# Patient Record
Sex: Male | Born: 1955 | Race: White | Hispanic: No | Marital: Married | Smoking: Never smoker
Health system: Southern US, Community
[De-identification: ages and names within clinical notes are randomized; demographics above are authoritative.]

---

## 1999-03-18 ENCOUNTER — Emergency Department (HOSPITAL_COMMUNITY): Admission: EM | Admit: 1999-03-18 | Discharge: 1999-03-18 | Payer: Self-pay | Admitting: Emergency Medicine

## 1999-05-03 ENCOUNTER — Emergency Department (HOSPITAL_COMMUNITY): Admission: EM | Admit: 1999-05-03 | Discharge: 1999-05-03 | Payer: Self-pay | Admitting: Emergency Medicine

## 2017-10-24 ENCOUNTER — Other Ambulatory Visit: Payer: Self-pay

## 2017-10-24 ENCOUNTER — Encounter (HOSPITAL_BASED_OUTPATIENT_CLINIC_OR_DEPARTMENT_OTHER): Payer: Self-pay | Admitting: *Deleted

## 2017-10-24 ENCOUNTER — Emergency Department (HOSPITAL_BASED_OUTPATIENT_CLINIC_OR_DEPARTMENT_OTHER): Payer: No Typology Code available for payment source

## 2017-10-24 ENCOUNTER — Emergency Department (HOSPITAL_BASED_OUTPATIENT_CLINIC_OR_DEPARTMENT_OTHER)
Admission: EM | Admit: 2017-10-24 | Discharge: 2017-10-24 | Disposition: A | Discharge status: Home / Self Care | Payer: No Typology Code available for payment source | Attending: Emergency Medicine | Admitting: Emergency Medicine

## 2017-10-24 DIAGNOSIS — B349 Viral infection, unspecified: Secondary | ICD-10-CM | POA: Diagnosis not present

## 2017-10-24 DIAGNOSIS — J069 Acute upper respiratory infection, unspecified: Secondary | ICD-10-CM | POA: Insufficient documentation

## 2017-10-24 DIAGNOSIS — R062 Wheezing: Secondary | ICD-10-CM | POA: Diagnosis present

## 2017-10-24 DIAGNOSIS — B9789 Other viral agents as the cause of diseases classified elsewhere: Secondary | ICD-10-CM

## 2017-10-24 DIAGNOSIS — R05 Cough: Secondary | ICD-10-CM | POA: Diagnosis not present

## 2017-10-24 MED ORDER — ALBUTEROL SULFATE HFA 108 (90 BASE) MCG/ACT IN AERS: 1.0000 | INHALATION_SPRAY | Freq: Four times a day (QID) | RESPIRATORY_TRACT | 0 refills | Status: AC | PRN

## 2017-10-24 MED ORDER — PREDNISONE 20 MG PO TABS: 40.0000 mg | ORAL_TABLET | Freq: Every day | ORAL | 0 refills | Status: AC

## 2017-10-24 MED ORDER — BENZONATATE 100 MG PO CAPS: 100.0000 mg | ORAL_CAPSULE | Freq: Three times a day (TID) | ORAL | 0 refills | Status: AC

## 2017-10-24 MED ORDER — ALBUTEROL SULFATE (2.5 MG/3ML) 0.083% IN NEBU
2.5000 mg | INHALATION_SOLUTION | Freq: Once | RESPIRATORY_TRACT | Status: AC
  Administered 2017-10-24: 2.5 mg via RESPIRATORY_TRACT
  Filled 2017-10-24: qty 3

## 2017-10-24 NOTE — ED Triage Notes (Signed)
Pt reports intermittent wheezing and productive cough since Christmas Eve. Denies fever, n/v/d, sob, chest pain.

## 2017-10-24 NOTE — ED Provider Notes (Signed)
Emergency Department Provider Note   I have reviewed the triage vital signs and the nursing notes.   HISTORY  Chief Complaint Cough   HPI Robert Escobar is a 61 y.o. male resents to the emergency department for evaluation of productive cough and some intermittent wheezing over the past 6 days.  Patient states that he was outside working became hot.  He began removing clothes and states that he was working outside in the cold.  2 days later he began having cough and wheezing symptoms.  Denies fevers.  No hemoptysis.  He denies any nausea, vomiting, diarrhea.  No chest pain.  Patient states he does have some abdominal soreness with coughing.  He is tried Mucinex at home with no relief.    History reviewed. No pertinent past medical history.  There are no active problems to display for this patient.   History reviewed. No pertinent surgical history.    Allergies Patient has no known allergies.  No family history on file.  Social History Social History   Tobacco Use  . Smoking status: Never Smoker  . Smokeless tobacco: Never Used  Substance Use Topics  . Alcohol use: Yes    Comment: occ  . Drug use: No    Review of Systems  Constitutional: No fever/chills Eyes: No visual changes. ENT: No sore throat. Cardiovascular: Denies chest pain. Respiratory: Denies shortness of breath. Positive cough and intermittent wheezing.  Gastrointestinal: No abdominal pain.  No nausea, no vomiting.  No diarrhea.  No constipation. Genitourinary: Negative for dysuria. Musculoskeletal: Negative for back pain.  Skin: Negative for rash. Neurological: Negative for headaches, focal weakness or numbness.  10-point ROS otherwise negative.  ____________________________________________   PHYSICAL EXAM:  VITAL SIGNS: ED Triage Vitals [10/24/17 0757]  Enc Vitals Group     BP (!) 161/119     Pulse Rate 96     Resp 18     Temp 97.8 F (36.6 C)     Temp Source Oral     SpO2 96 %   Weight 253 lb 4.8 oz (114.9 kg)     Height 6' (1.829 m)   Constitutional: Alert and oriented. Well appearing and in no acute distress. Eyes: Conjunctivae are normal. Head: Atraumatic. Nose: No congestion/rhinnorhea. Mouth/Throat: Mucous membranes are moist.  Neck: No stridor. Cardiovascular: Normal rate, regular rhythm. Good peripheral circulation. Grossly normal heart sounds.   Respiratory: Normal respiratory effort.  No retractions. Lungs with faint end-expiratory wheezing with forced expiration or coughing.  Gastrointestinal: Soft and nontender. No distention.  Musculoskeletal: No gross deformities of extremities. Neurologic:  Normal speech and language. No gross focal neurologic deficits are appreciated.  Skin:  Skin is warm, dry and intact. No rash noted.  ____________________________________________  RADIOLOGY  CXR:     FINDINGS:  The heart size and mediastinal contours are within normal limits.  Both lungs are clear. The visualized skeletal structures are  unremarkable.    IMPRESSION:  No active cardiopulmonary disease.      Electronically Signed  By: Signa Kellaylor Stroud M.D.  On: 10/24/2017 08:56    ____________________________________________   PROCEDURES  Procedure(s) performed:   Procedures  None ____________________________________________   INITIAL IMPRESSION / ASSESSMENT AND PLAN / ED COURSE  Pertinent labs & imaging results that were available during my care of the patient were reviewed by me and considered in my medical decision making (see chart for details).  Patient presents emergency department for evaluation of productive cough with intermittent wheezing over the past 6  days.  No fevers.  No hypoxemia on room air.  Patient does have faint end expiratory wheezing with forced expiration.  Plan for albuterol nebulizer treatment and chest x-ray to rule out pneumonia.  Clinically suspect more of a bronchitis picture, possibly viral. No COPD or  smoking history.   Patient slightly improved after neb. CXR negative. Plan for supportive care at home and PCP follow up.   At this time, I do not feel there is any life-threatening condition present. I have reviewed and discussed all results (EKG, imaging, lab, urine as appropriate), exam findings with patient. I have reviewed nursing notes and appropriate previous records.  I feel the patient is safe to be discharged home without further emergent workup. Discussed usual and customary return precautions. Patient and family (if present) verbalize understanding and are comfortable with this plan.  Patient will follow-up with their primary care provider. If they do not have a primary care provider, information for follow-up has been provided to them. All questions have been answered.  ____________________________________________  FINAL CLINICAL IMPRESSION(S) / ED DIAGNOSES  Final diagnoses:  Viral URI with cough     MEDICATIONS GIVEN DURING THIS VISIT:  Medications  albuterol (PROVENTIL) (2.5 MG/3ML) 0.083% nebulizer solution 2.5 mg (2.5 mg Nebulization Given 10/24/17 0822)     NEW OUTPATIENT MEDICATIONS STARTED DURING THIS VISIT:  Albuterol inh, Prednisone 40 mg x 5 days, and Tessalon.   Note:  This document was prepared using Dragon voice recognition software and may include unintentional dictation errors.  Alona BeneJoshua Mantaj Chamberlin, MD Emergency Medicine    Shirl Weir, Arlyss RepressJoshua G, MD 10/25/17 [phone]

## 2017-10-24 NOTE — ED Notes (Signed)
ED Provider at bedside. 

## 2017-10-24 NOTE — Discharge Instructions (Signed)

## 2018-05-08 IMAGING — DX DG CHEST 2V
2 series · 2 of 2 positions shown · non-contrast
Comparison: None

CLINICAL DATA: Intermittent wheezing and cough for 6 days

EXAM:
CHEST  2 VIEW

[chest pa]
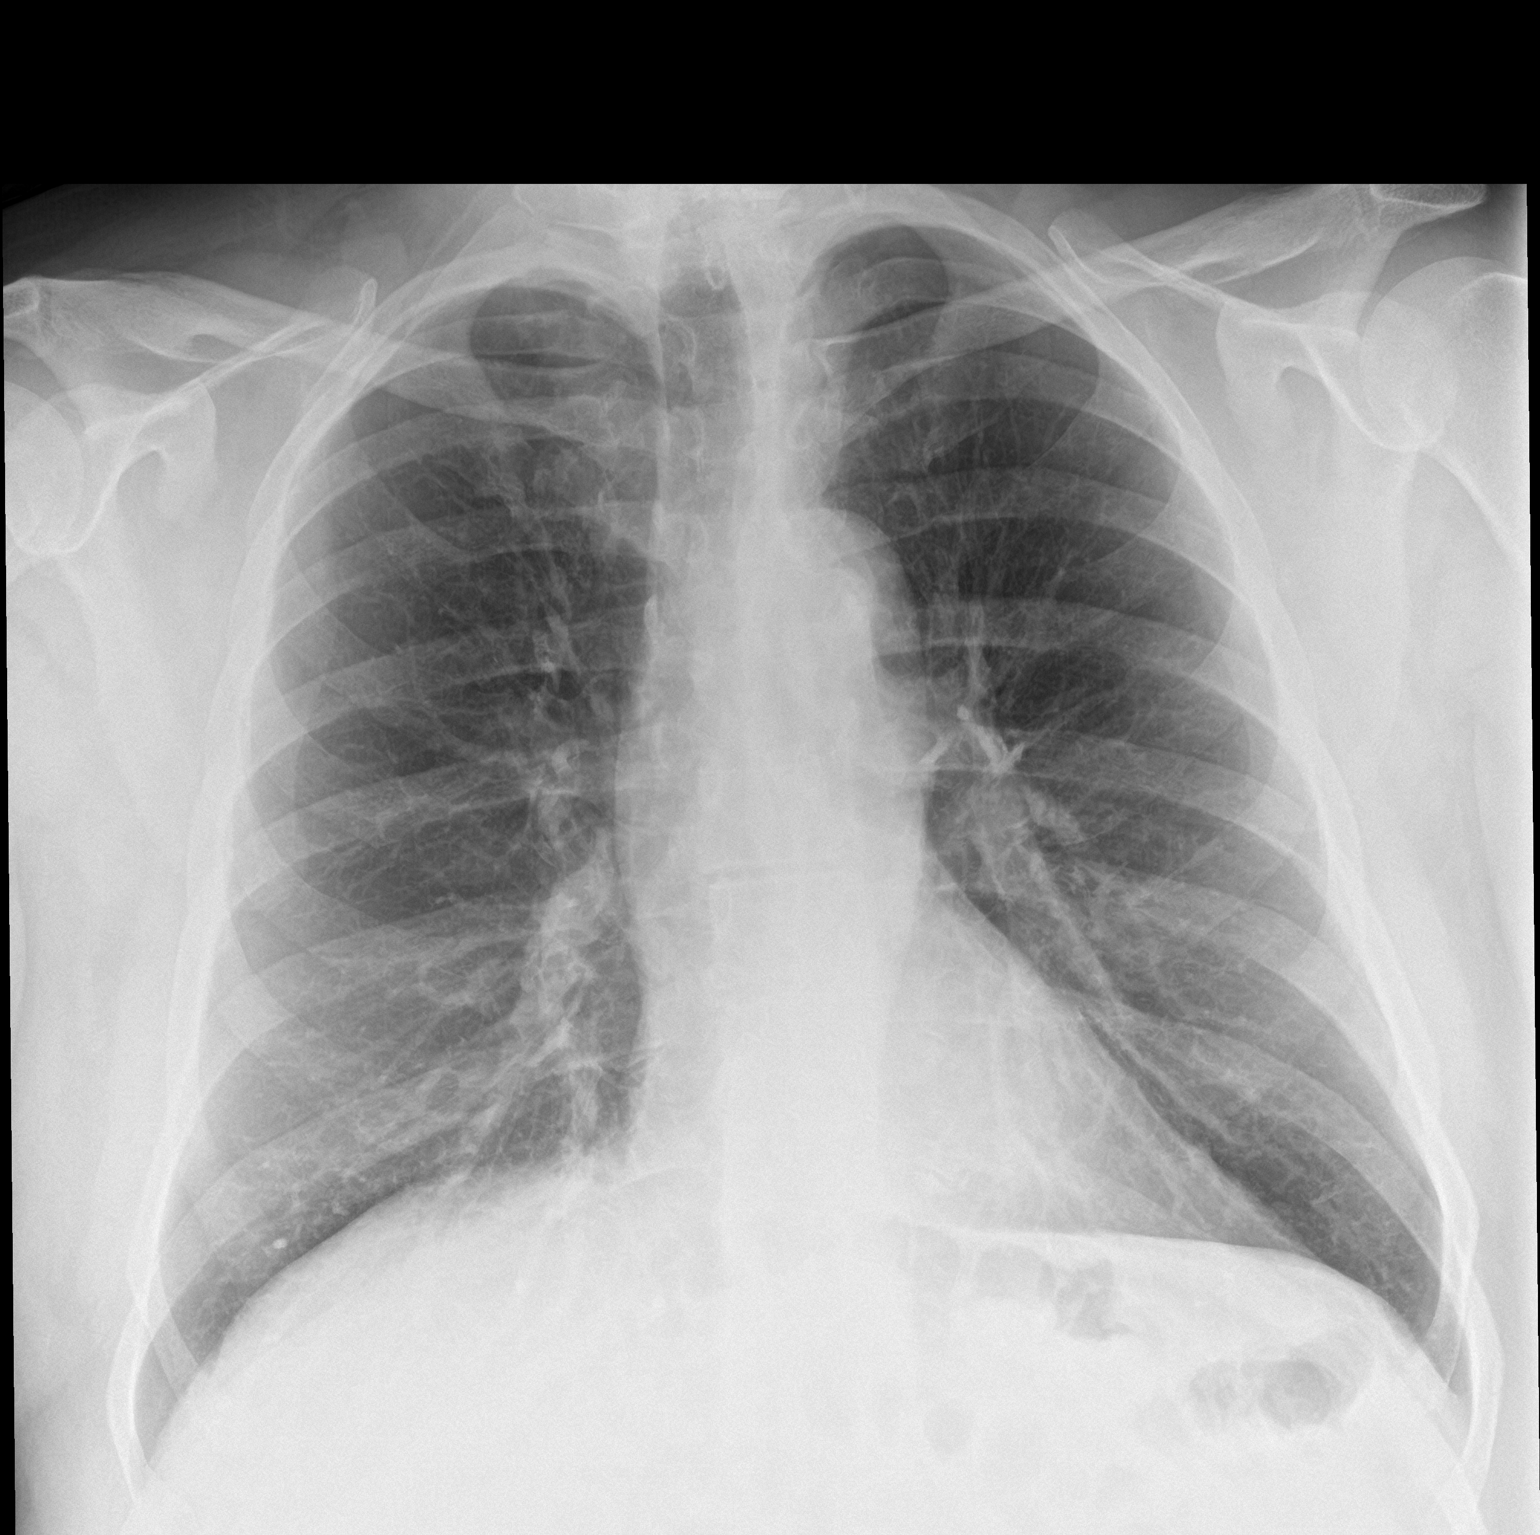

[chest lat]
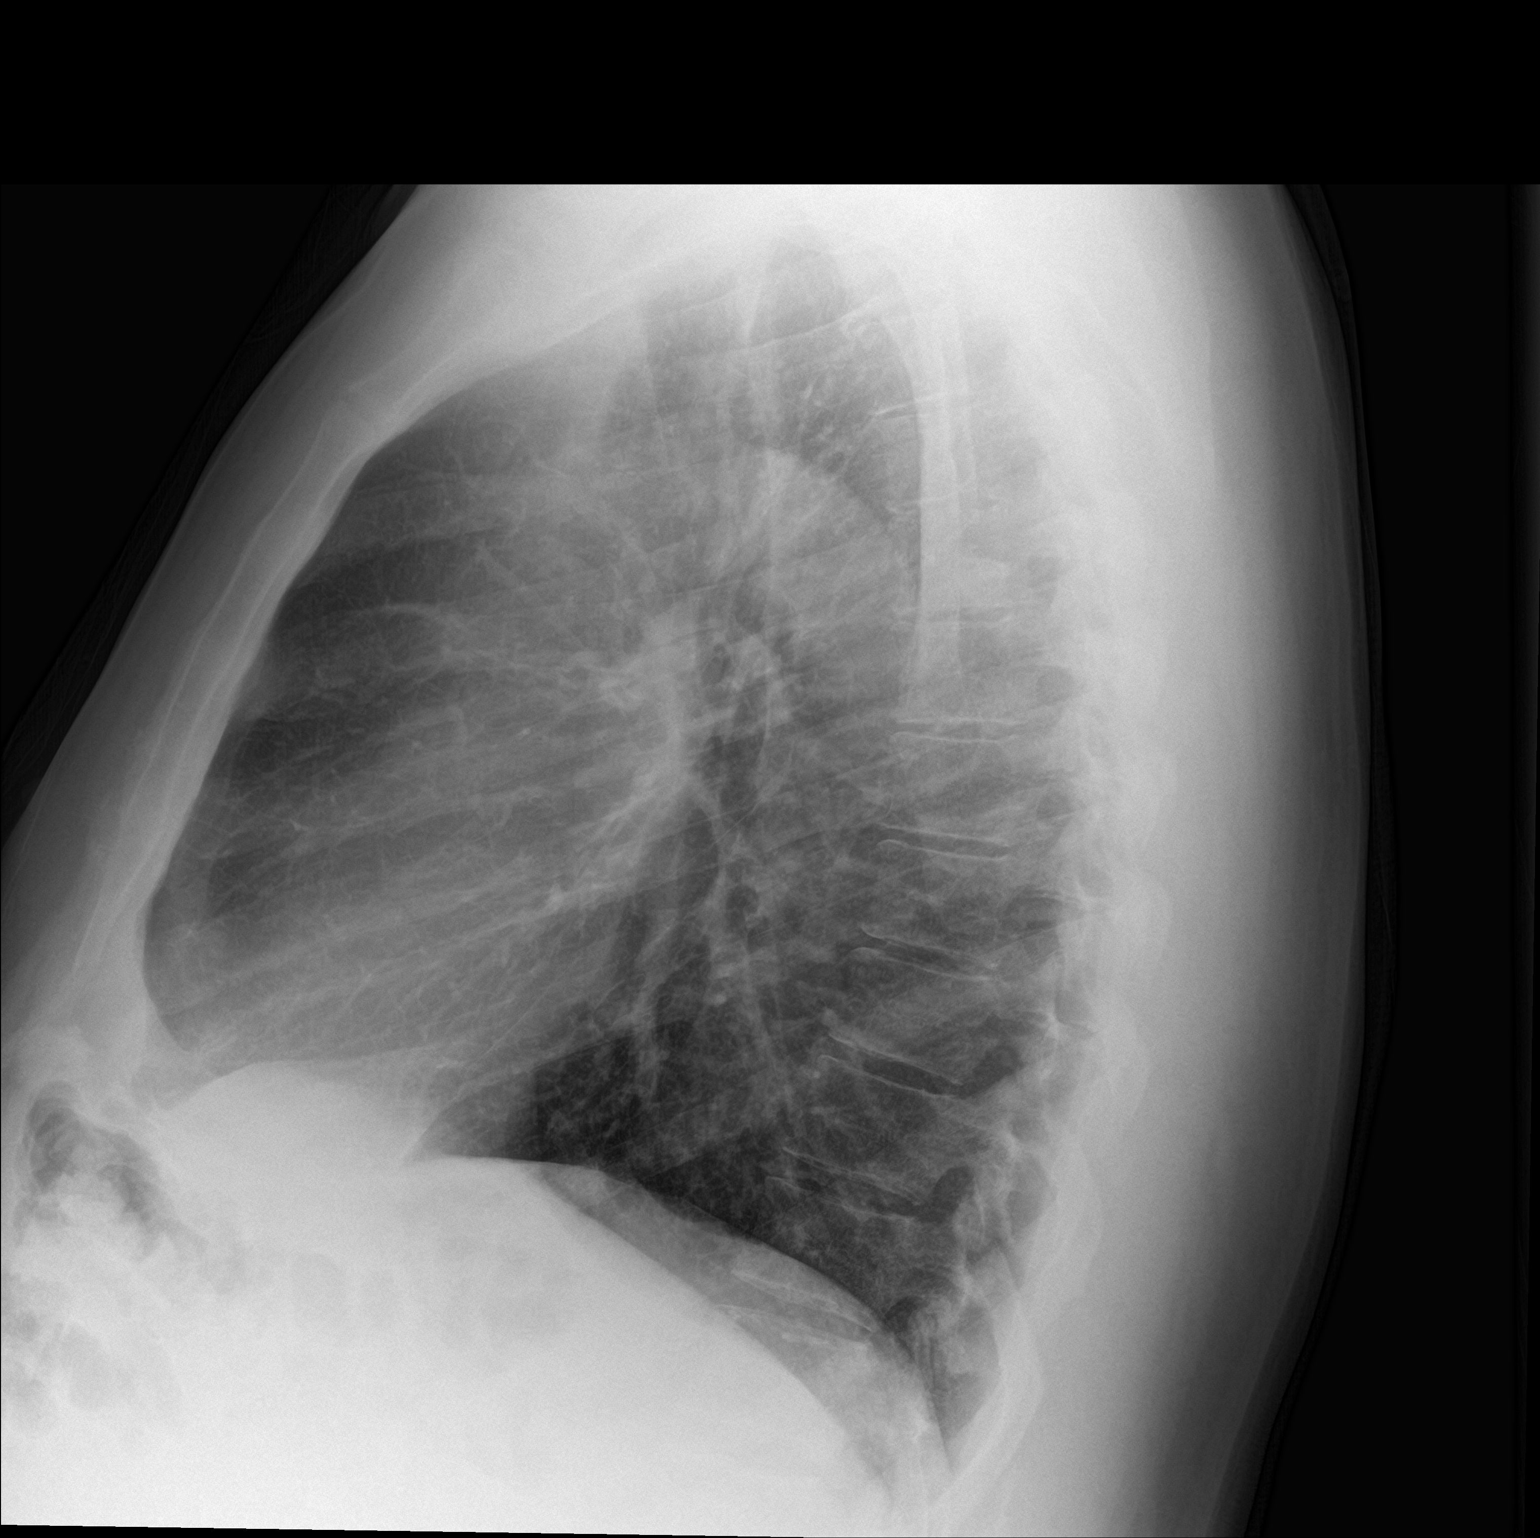

[2 of 2 positions shown; findings below may reference images not displayed]

FINDINGS: The heart size and mediastinal contours are within normal limits.
Both lungs are clear. The visualized skeletal structures are
unremarkable.
IMPRESSION: No active cardiopulmonary disease.
